# Patient Record
Sex: Female | Born: 1985 | Race: Black or African American | Hispanic: No | Marital: Single | Smoking: Never smoker
Health system: Southern US, Community
[De-identification: ages and names within clinical notes are randomized; demographics above are authoritative.]

## PROBLEM LIST (undated history)

## (undated) DIAGNOSIS — F419 Anxiety disorder, unspecified: Secondary | ICD-10-CM

## (undated) DIAGNOSIS — J45909 Unspecified asthma, uncomplicated: Secondary | ICD-10-CM

## (undated) HISTORY — DX: Unspecified asthma, uncomplicated: J45.909

## (undated) HISTORY — DX: Anxiety disorder, unspecified: F41.9

---
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
## 2005-08-09 ENCOUNTER — Other Ambulatory Visit: Admission: RE | Admit: 2005-08-09 | Discharge: 2005-08-09 | Payer: Self-pay | Admitting: Obstetrics & Gynecology

## 2009-02-08 ENCOUNTER — Other Ambulatory Visit: Admission: RE | Admit: 2009-02-08 | Discharge: 2009-02-08 | Payer: Self-pay | Admitting: Family Medicine

## 2010-05-19 ENCOUNTER — Other Ambulatory Visit: Admission: RE | Admit: 2010-05-19 | Discharge: 2010-05-19 | Payer: Self-pay | Admitting: Family Medicine

## 2012-02-15 ENCOUNTER — Other Ambulatory Visit: Payer: Self-pay | Admitting: Physician Assistant

## 2012-02-15 ENCOUNTER — Other Ambulatory Visit (HOSPITAL_COMMUNITY)
Admission: RE | Admit: 2012-02-15 | Discharge: 2012-02-15 | Disposition: A | Discharge status: Home / Self Care | Payer: BC Managed Care – PPO | Source: Ambulatory Visit | Attending: Family Medicine | Admitting: Family Medicine

## 2012-02-15 DIAGNOSIS — Z0000 Encounter for general adult medical examination without abnormal findings: Secondary | ICD-10-CM | POA: Insufficient documentation

## 2013-06-08 ENCOUNTER — Ambulatory Visit: Payer: BC Managed Care – PPO

## 2013-06-08 ENCOUNTER — Encounter: Payer: Self-pay | Admitting: Internal Medicine

## 2013-06-08 ENCOUNTER — Ambulatory Visit (INDEPENDENT_AMBULATORY_CARE_PROVIDER_SITE_OTHER): Payer: BC Managed Care – PPO | Admitting: Internal Medicine

## 2013-06-08 DIAGNOSIS — M25511 Pain in right shoulder: Secondary | ICD-10-CM

## 2013-06-08 DIAGNOSIS — M25519 Pain in unspecified shoulder: Secondary | ICD-10-CM

## 2013-06-08 DIAGNOSIS — M542 Cervicalgia: Secondary | ICD-10-CM

## 2013-06-08 DIAGNOSIS — R109 Unspecified abdominal pain: Secondary | ICD-10-CM

## 2013-06-08 DIAGNOSIS — R4589 Other symptoms and signs involving emotional state: Secondary | ICD-10-CM

## 2013-06-08 DIAGNOSIS — R0781 Pleurodynia: Secondary | ICD-10-CM

## 2013-06-08 DIAGNOSIS — F411 Generalized anxiety disorder: Secondary | ICD-10-CM

## 2013-06-08 DIAGNOSIS — R079 Chest pain, unspecified: Secondary | ICD-10-CM

## 2013-06-08 MED ORDER — CYCLOBENZAPRINE HCL 10 MG PO TABS: 10.0000 mg | ORAL_TABLET | Freq: Every day | ORAL | Status: DC

## 2013-06-08 MED ORDER — ALPRAZOLAM 0.25 MG PO TABS: 0.2500 mg | ORAL_TABLET | Freq: Three times a day (TID) | ORAL | Status: AC | PRN

## 2013-06-08 MED ORDER — MELOXICAM 15 MG PO TABS: 15.0000 mg | ORAL_TABLET | Freq: Every day | ORAL | Status: DC
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
## 2013-09-23 ENCOUNTER — Other Ambulatory Visit: Payer: Self-pay

## 2014-03-11 ENCOUNTER — Other Ambulatory Visit: Payer: Self-pay

## 2014-09-03 ENCOUNTER — Ambulatory Visit (INDEPENDENT_AMBULATORY_CARE_PROVIDER_SITE_OTHER): Payer: 59 | Admitting: Emergency Medicine

## 2014-09-03 VITALS — BP 108/60 | HR 92 | Temp 100.3°F | Resp 18 | Ht 66.5 in | Wt 142.8 lb

## 2014-09-03 DIAGNOSIS — H66002 Acute suppurative otitis media without spontaneous rupture of ear drum, left ear: Secondary | ICD-10-CM

## 2014-09-03 DIAGNOSIS — Z23 Encounter for immunization: Secondary | ICD-10-CM

## 2014-09-03 DIAGNOSIS — J209 Acute bronchitis, unspecified: Secondary | ICD-10-CM

## 2014-09-03 DIAGNOSIS — J4541 Moderate persistent asthma with (acute) exacerbation: Secondary | ICD-10-CM

## 2014-09-03 DIAGNOSIS — J0140 Acute pansinusitis, unspecified: Secondary | ICD-10-CM

## 2014-09-03 MED ORDER — ALBUTEROL SULFATE (2.5 MG/3ML) 0.083% IN NEBU
2.5000 mg | INHALATION_SOLUTION | Freq: Once | RESPIRATORY_TRACT | Status: AC
Start: 2014-09-03 — End: 2014-09-03
  Administered 2014-09-03: 2.5 mg via RESPIRATORY_TRACT

## 2014-09-03 MED ORDER — HYDROCOD POLST-CHLORPHEN POLST 10-8 MG/5ML PO LQCR: 5.0000 mL | Freq: Two times a day (BID) | ORAL | Status: DC | PRN

## 2014-09-03 MED ORDER — PSEUDOEPHEDRINE-GUAIFENESIN ER 60-600 MG PO TB12
1.0000 | ORAL_TABLET | Freq: Two times a day (BID) | ORAL | Status: DC
Start: 2014-09-03 — End: 2014-09-28

## 2014-09-03 MED ORDER — ALBUTEROL SULFATE (2.5 MG/3ML) 0.083% IN NEBU
2.5000 mg | INHALATION_SOLUTION | Freq: Once | RESPIRATORY_TRACT | Status: AC
  Administered 2014-09-03: 2.5 mg via RESPIRATORY_TRACT

## 2014-09-03 MED ORDER — AMOXICILLIN-POT CLAVULANATE 875-125 MG PO TABS: 1.0000 | ORAL_TABLET | Freq: Two times a day (BID) | ORAL | Status: DC

## 2014-09-03 MED ORDER — ALBUTEROL SULFATE HFA 108 (90 BASE) MCG/ACT IN AERS: 2.0000 | INHALATION_SPRAY | RESPIRATORY_TRACT | Status: AC | PRN

## 2014-09-03 NOTE — Progress Notes (Signed)
[facility] [address], [place] [address] [phone]  Date:  09/03/2014   Name:  Alison Orr   DOB:  Apr 19, 1986   MRN:  [id number]  PCP:  No PCP Per Patient    Chief Complaint: Fluid in Ears; Headache; and Medication Refill   History of Present Illness:  Alison Orr is a 29 y.o. very pleasant female patient who presents with the following:  Ill this week.  Has nasal congestion and headache.  Has pain in left ear. Purulent nasal drainage Cough productive purulent sputum with increased wheezing Fever and chills, malaise and myalgias. No nausea or vomiting.  No stool change No improvement with over the counter medications or other home remedies. Denies other complaint or health concern today.   There are no active problems to display for this patient.   Past Medical History  Diagnosis Date  . Asthma   . Anxiety     History reviewed. No pertinent past surgical history.  History  Substance Use Topics  . Smoking status: Never Smoker   . Smokeless tobacco: Not on file  . Alcohol Use: Yes     Comment: socially    Family History  Problem Relation Age of Onset  . Breast cancer Maternal Grandmother     Allergies  Allergen Reactions  . Ciprofloxacin Hcl Other (See Comments)    Change in mental status    Medication list has been reviewed and updated.  Current Outpatient Prescriptions on File Prior to Visit  Medication Sig Dispense Refill  . cyclobenzaprine (FLEXERIL) 10 MG tablet Take 1 tablet (10 mg total) by mouth at bedtime. (Patient not taking: Reported on 09/03/2014) 14 tablet 0  . meloxicam (MOBIC) 15 MG tablet Take 1 tablet (15 mg total) by mouth daily. (Patient not taking: Reported on 09/03/2014) 20 tablet 2   No current facility-administered medications on file prior to visit.    Review of Systems:  As per HPI, otherwise negative.    Physical Examination: Filed Vitals:   09/03/14 0955  BP: 108/60  Pulse: 92  Temp:  100.3 F (37.9 C)  Resp: 18   Filed Vitals:   09/03/14 0955  Height: 5' 6.5" (1.689 m)  Weight: 142 lb 12.8 oz (64.774 kg)   Body mass index is 22.71 kg/(m^2). Ideal Body Weight: Weight in (lb) to have BMI = 25: 156.9  GEN: WDWN, NAD, Non-toxic, A & O x 3 HEENT: Atraumatic, Normocephalic. Neck supple. No masses, No LAD. Ears and Nose: No external deformity. CV: RRR, No M/G/R. No JVD. No thrill. No extra heart sounds. PULM: diffuse  Wheezes, no crackles, rhonchi. No retractions. No resp. distress. No accessory muscle use. ABD: S, NT, ND, +BS. No rebound. No HSM. EXTR: No c/c/e NEURO Normal gait.  PSYCH: Normally interactive. Conversant. Not depressed or anxious appearing.  Calm demeanor.    Assessment and Plan: Exacerbation asthma Acute bronchitis Acute sinusitis augmentin mucinex tusionex Albuterol MDI Neb  Signed,  Phillips OdorJeffery Audry Pecina, MD

## 2014-09-03 NOTE — Patient Instructions (Signed)

## 2014-09-28 ENCOUNTER — Ambulatory Visit (INDEPENDENT_AMBULATORY_CARE_PROVIDER_SITE_OTHER): Payer: 59 | Admitting: Family Medicine

## 2014-09-28 VITALS — BP 122/74 | HR 78 | Temp 99.0°F | Resp 18 | Ht 66.0 in | Wt 143.0 lb

## 2014-09-28 DIAGNOSIS — H6982 Other specified disorders of Eustachian tube, left ear: Secondary | ICD-10-CM

## 2014-09-28 DIAGNOSIS — R490 Dysphonia: Secondary | ICD-10-CM

## 2014-09-28 MED ORDER — PREDNISONE 20 MG PO TABS: ORAL_TABLET | ORAL | Status: AC

## 2014-09-28 NOTE — Patient Instructions (Signed)
We are going to treat you for a clogged ear tube (eustacian tube dysfunction) with prednisone. Use it for 3 days, then for 3 days more if needed Let me know if you are not getting better in the next few days- Sooner if worse.

## 2014-09-28 NOTE — Progress Notes (Signed)
[facility] [address], [place] [address] [phone]  Date:  09/28/2014   Name:  Alison Orr   DOB:  12/16/1985   MRN:  [id number]  PCP:  No PCP Per Patient    Chief Complaint: Ear Fullness and Cough   History of Present Illness:  Alison Orr is a 29 y.o. very pleasant female patient who presents with the following:  She is having a hard time hearing out of her left ear.   She still has some cough, hoarse voice.  She was treated with augmentin about 3 weeks ago.  Her ear has "pressure" but not pain. However she continues to notice "pressure, popping, fluid.'  She is coughing up some clear mucus at times.  She does still have some nasal drainage as well She has not noted a fever Energy level is about like normal  She is generally in good health She has not noted wheezing- she has an inhaler but she rarely needs it  Menses are due any day now There are no active problems to display for this patient.   Past Medical History  Diagnosis Date  . Asthma   . Anxiety     History reviewed. No pertinent past surgical history.  History  Substance Use Topics  . Smoking status: Never Smoker   . Smokeless tobacco: Not on file  . Alcohol Use: Yes     Comment: socially    Family History  Problem Relation Age of Onset  . Breast cancer Maternal Grandmother     Allergies  Allergen Reactions  . Ciprofloxacin Hcl Other (See Comments)    Change in mental status    Medication list has been reviewed and updated.  Current Outpatient Prescriptions on File Prior to Visit  Medication Sig Dispense Refill  . albuterol (VENTOLIN HFA) 108 (90 BASE) MCG/ACT inhaler Inhale 2 puffs into the lungs every 4 (four) hours as needed for wheezing or shortness of breath. 18 g 12   No current facility-administered medications on file prior to visit.    Review of Systems:  As per HPI- otherwise negative.   Physical Examination: Filed Vitals:   09/28/14 1428   BP: 122/74  Pulse: 78  Temp: 99 F (37.2 C)  Resp: 18   Filed Vitals:   09/28/14 1428  Height: 5\' 6"  (1.676 m)  Weight: 143 lb (64.864 kg)   Body mass index is 23.09 kg/(m^2). Ideal Body Weight: Weight in (lb) to have BMI = 25: 154.6  GEN: WDWN, NAD, Non-toxic, A & O x 3, looks well, fit build.  Slight hoarseness  HEENT: Atraumatic, Normocephalic. Neck supple. No masses, No LAD.  Bilateral TM wnl, oropharynx normal.  PEERL,EOMI.    Nasal cavity is inflamed and congested Ears and Nose: No external deformity. CV: RRR, No M/G/R. No JVD. No thrill. No extra heart sounds. PULM: CTA B, no wheezes, crackles, rhonchi. No retractions. No resp. distress. No accessory muscle use. EXTR: No c/c/e NEURO Normal gait.  PSYCH: Normally interactive. Conversant. Not depressed or anxious appearing.  Calm demeanor.    Assessment and Plan: ETD (eustachian tube dysfunction), left - Plan: predniSONE (DELTASONE) 20 MG tablet  Hoarse voice quality - Plan: predniSONE (DELTASONE) 20 MG tablet  Will treat for persistent ETD with prednisone- she will take for 3-6 days. She will let me know if not feeling better in the next few days-  Sooner if worse.      Signed Abbe AmsterdamJessica Copland, MD

## 2014-10-05 ENCOUNTER — Other Ambulatory Visit: Payer: Self-pay

## 2014-10-06 LAB — CYTOLOGY - PAP
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
## 2015-10-13 ENCOUNTER — Other Ambulatory Visit: Payer: Self-pay | Admitting: Obstetrics & Gynecology
